# Patient Record
Sex: Female | Born: 1985 | Hispanic: Refuse to answer | Marital: Married | Smoking: Never smoker
Health system: Southern US, Community
[De-identification: ages and names within clinical notes are randomized; demographics above are authoritative.]

## PROBLEM LIST (undated history)

## (undated) DIAGNOSIS — Z789 Other specified health status: Secondary | ICD-10-CM

## (undated) DIAGNOSIS — K219 Gastro-esophageal reflux disease without esophagitis: Secondary | ICD-10-CM

## (undated) HISTORY — PX: NO PAST SURGERIES: SHX2092

---

## 2012-08-18 ENCOUNTER — Emergency Department (HOSPITAL_BASED_OUTPATIENT_CLINIC_OR_DEPARTMENT_OTHER)
Admission: EM | Admit: 2012-08-18 | Discharge: 2012-08-18 | Disposition: A | Discharge status: Home / Self Care | Payer: BC Managed Care – PPO | Attending: Emergency Medicine | Admitting: Emergency Medicine

## 2012-08-18 ENCOUNTER — Encounter (HOSPITAL_BASED_OUTPATIENT_CLINIC_OR_DEPARTMENT_OTHER): Payer: Self-pay | Admitting: *Deleted

## 2012-08-18 ENCOUNTER — Emergency Department (HOSPITAL_BASED_OUTPATIENT_CLINIC_OR_DEPARTMENT_OTHER): Payer: BC Managed Care – PPO

## 2012-08-18 DIAGNOSIS — M79609 Pain in unspecified limb: Secondary | ICD-10-CM | POA: Insufficient documentation

## 2012-08-18 DIAGNOSIS — M79605 Pain in left leg: Secondary | ICD-10-CM

## 2012-08-18 NOTE — ED Provider Notes (Signed)
History    CSN: [id number] Arrival date & time 08/18/12  0272  First MD Initiated Contact with Patient 08/18/12 [phone]     Chief Complaint  Patient presents with  . Leg Pain   (Consider location/radiation/quality/duration/timing/severity/associated sxs/prior Treatment) Patient is a 27 y.o. female presenting with leg pain.  Leg Pain  Pt with no significant PMH reports she has had aching pain in L posterior thigh since yesterday. No recent injury or overuse. She woke up today and the leg felt cold. She has family history of DVT/PE and so she became concerned that she may have DVT. Denies leg swelling, CP, SOB, palpitations.    History reviewed. No pertinent past medical history. History reviewed. No pertinent past surgical history. History reviewed. No pertinent family history. History  Substance Use Topics  . Smoking status: Never Smoker   . Smokeless tobacco: Not on file  . Alcohol Use: Not on file   OB History   Grav Para Term Preterm Abortions TAB SAB Ect Mult Living                 Review of Systems All other systems reviewed and are negative except as noted in HPI.   Allergies  Review of patient's allergies indicates no known allergies.  Home Medications   Current Outpatient Rx  Name  Route  Sig  Dispense  Refill  . levonorgestrel-ethinyl estradiol (AVIANE,ALESSE,LESSINA) 0.1-20 MG-MCG tablet   Oral   Take 1 tablet by mouth daily.          BP 114/72  Pulse 83  Temp(Src) 97.9 F (36.6 C) (Oral)  Resp 16  Ht 5\' 3"  (1.6 m)  Wt 120 lb (54.432 kg)  BMI 21.26 kg/m2  SpO2 100%  LMP 08/16/2012 Physical Exam  Nursing note and vitals reviewed. Constitutional: She is oriented to person, place, and time. She appears well-developed and well-nourished.  HENT:  Head: Normocephalic and atraumatic.  Eyes: EOM are normal. Pupils are equal, round, and reactive to light.  Neck: Normal range of motion. Neck supple.  Cardiovascular: Normal rate, normal heart sounds and  intact distal pulses.   Pulmonary/Chest: Effort normal and breath sounds normal.  Abdominal: Bowel sounds are normal. She exhibits no distension. There is no tenderness.  Musculoskeletal: Normal range of motion. She exhibits tenderness (mild, L hamstring soreness, no tenderness over the deep vein system. neg Homan's). She exhibits no edema.  Neurological: She is alert and oriented to person, place, and time. She has normal strength. No cranial nerve deficit or sensory deficit.  Skin: Skin is warm and dry. No rash noted.  Psychiatric: She has a normal mood and affect.    ED Course  Procedures (including critical care time) Labs Reviewed - No data to display US Venous Img Lower Unilateral Left  08/18/2012   *RADIOLOGY REPORT*  Clinical Data: Left posterior thigh cramping.  Question DVT.  LEFT LOWER EXTREMITY VENOUS DUPLEX ULTRASOUND  Technique:  Gray-scale sonography with graded compression, as well as color Doppler and duplex ultrasound were performed to evaluate the deep venous system of the lower extremity from the level of the common femoral vein through the popliteal and proximal calf veins. Spectral Doppler was utilized to evaluate flow at rest and with distal augmentation maneuvers.  Comparison:  None.  Findings:  Normal compressibility of the common femoral, superficial femoral, and popliteal veins is demonstrated, as well as the visualized proximal calf veins.  No filling defects to suggest DVT on grayscale or color Doppler imaging.  Doppler waveforms show normal direction of venous flow, normal respiratory phasicity and response to augmentation.  IMPRESSION: No evidence of left lower extremity deep vein thrombosis.   Original Report Authenticated By: Carey Bullocks, M.D.   1. Left leg pain     MDM  [place] neg for DVT. Likely muscle strain. Advised NSAIDs if needed. Return for any other concerns.   Charles B. Bernette Mayers, MD 08/18/12 [phone]

## 2012-08-18 NOTE — ED Notes (Signed)
Patient transported to Ultrasound 

## 2012-08-18 NOTE — ED Notes (Signed)
C/o left leg pain since yesterday with some numbness this AM. Pt states that she recently started BCP.

## 2014-07-27 IMAGING — US US EXTREM LOW VENOUS*L*
1 series · 14 of 22 positions shown · non-contrast
Comparison: None.

CLINICAL DATA: Left posterior thigh cramping.  Question DVT.

LEFT LOWER EXTREMITY VENOUS DUPLEX ULTRASOUND
TECHNIQUE: Gray-scale sonography with graded compression, as well
as color Doppler and duplex ultrasound were performed to evaluate
the deep venous system of the lower extremity from the level of the
common femoral vein through the popliteal and proximal calf veins.
Spectral Doppler was utilized to evaluate flow at rest and with
distal augmentation maneuvers.

[Series 1: us extrem low venous*left* · 14 of 22 slices shown]
[im 1/22]
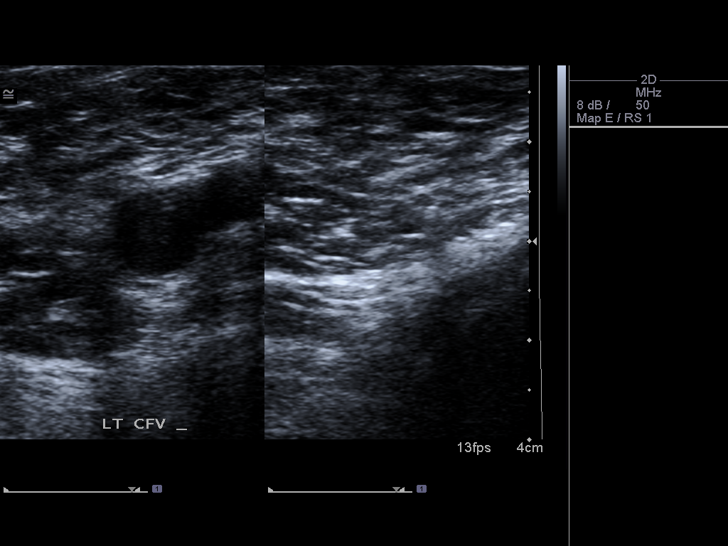
[im 3/22]
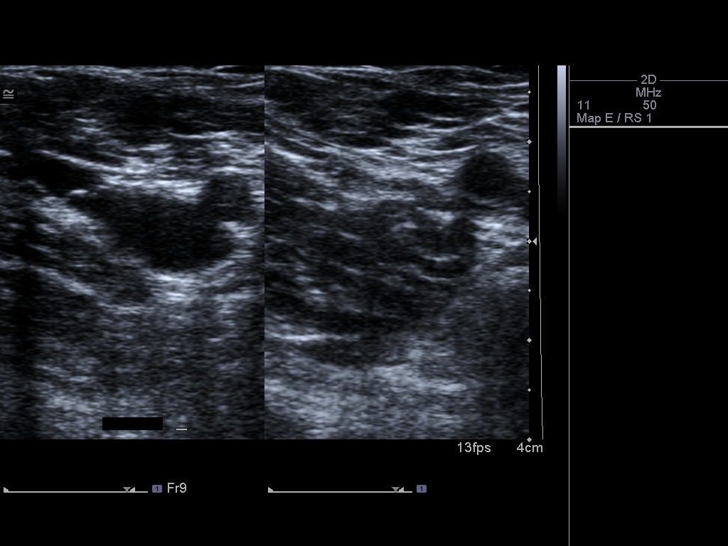
[im 4/22]
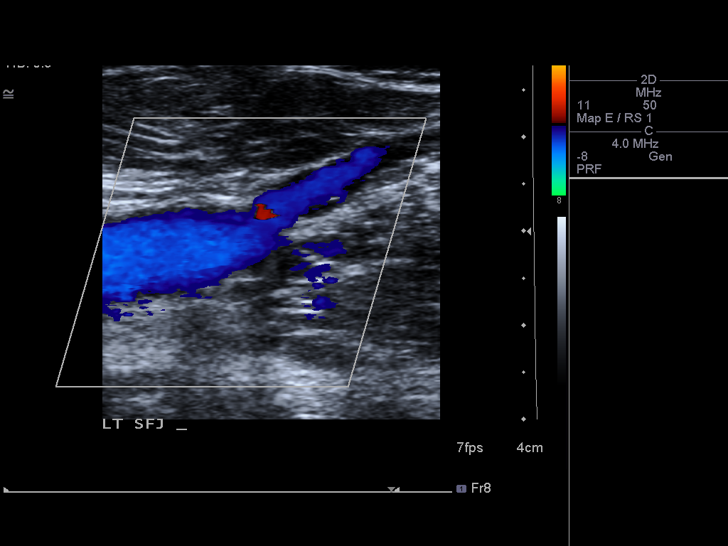
[im 6/22]
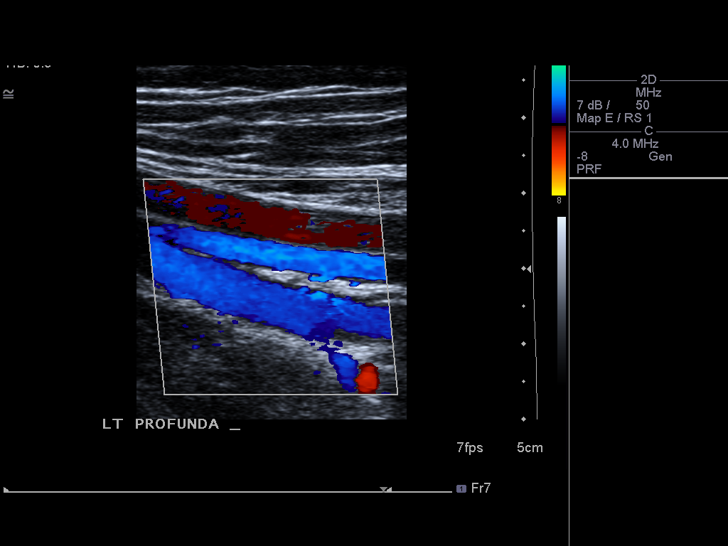
[im 8/22]
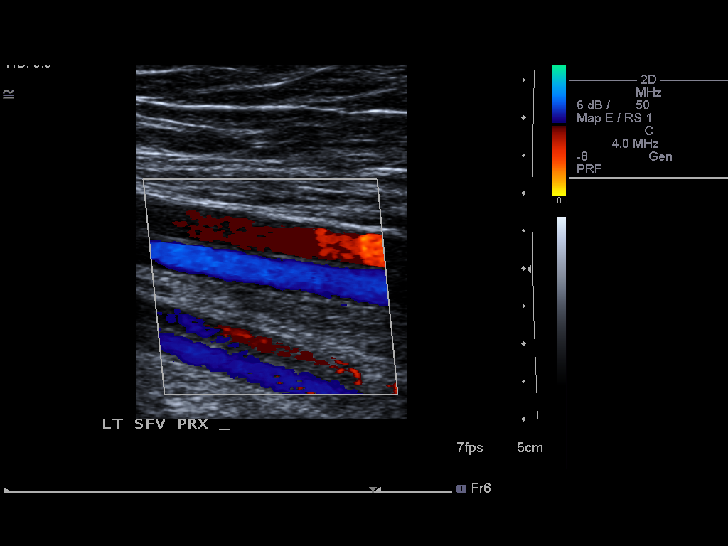
[im 9/22]
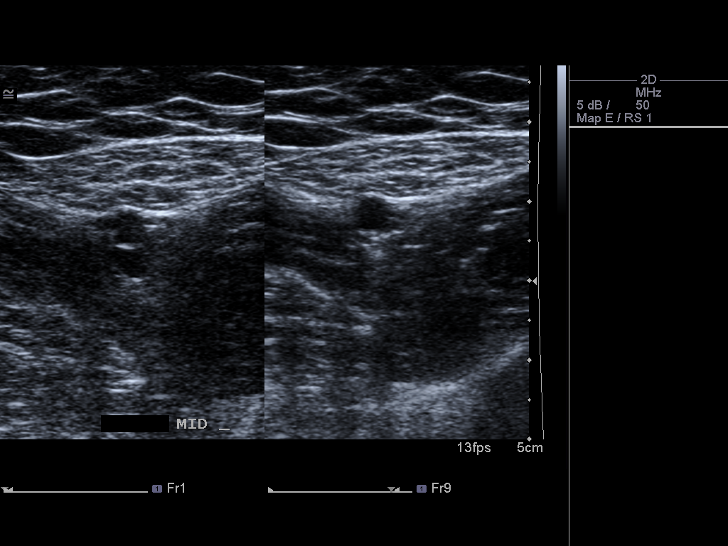
[im 11/22]
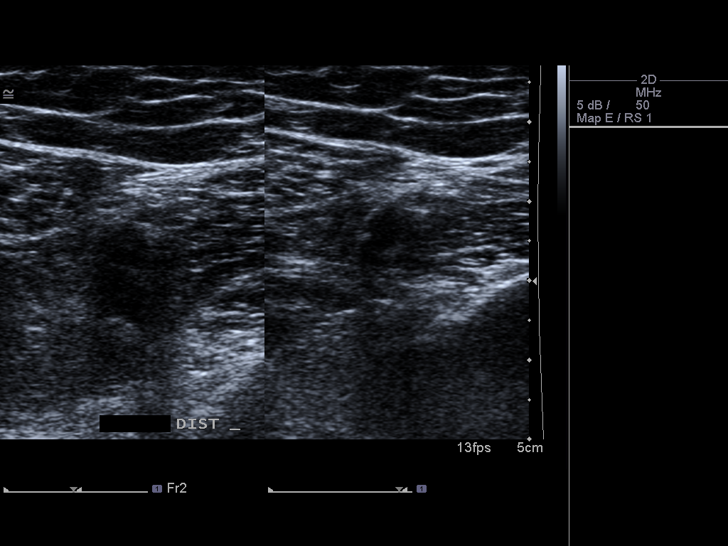
[im 12/22]
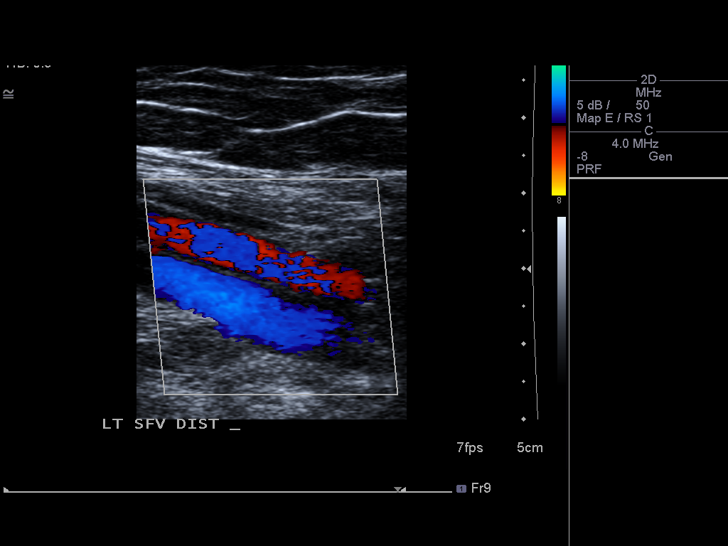
[im 14/22]
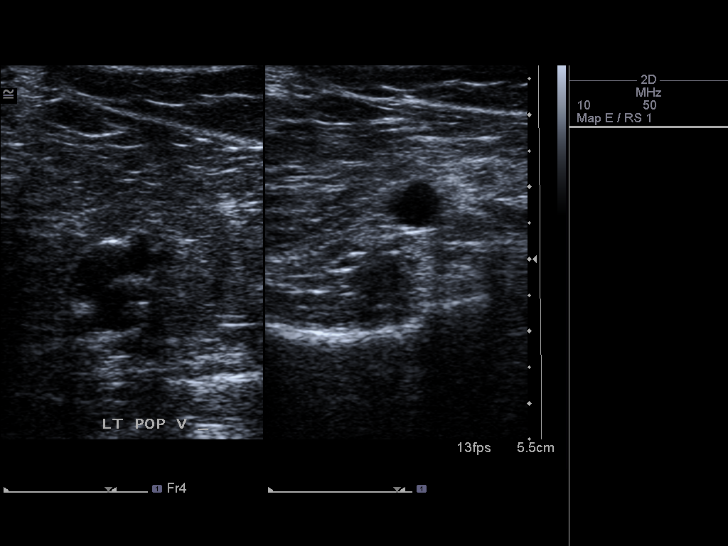
[im 15/22]
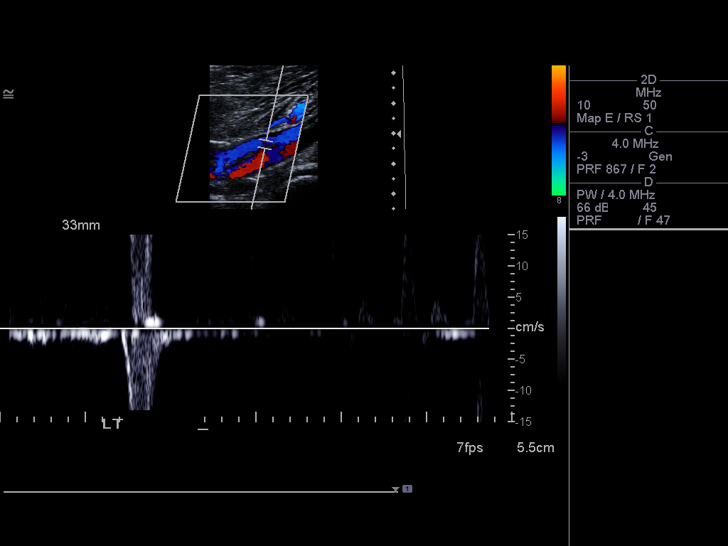
[im 17/22]
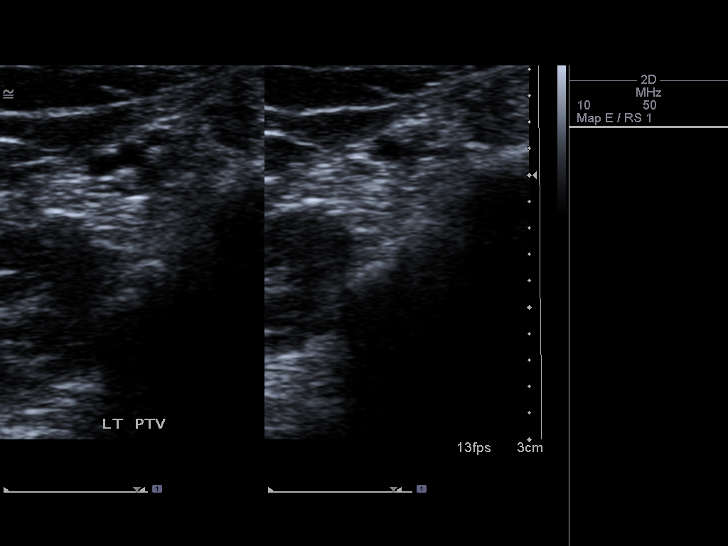
[im 19/22]
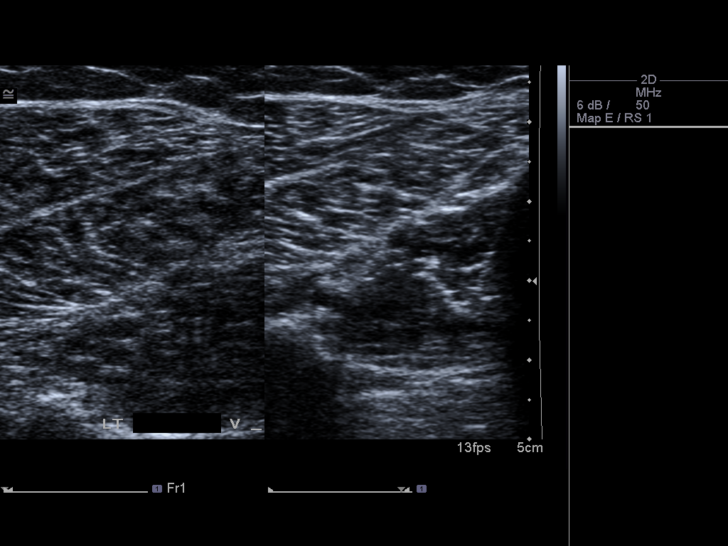
[im 20/22]
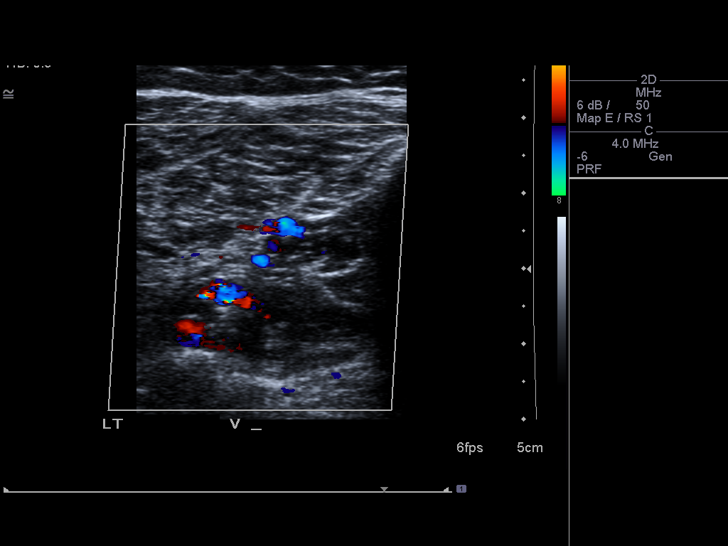
[im 22/22]
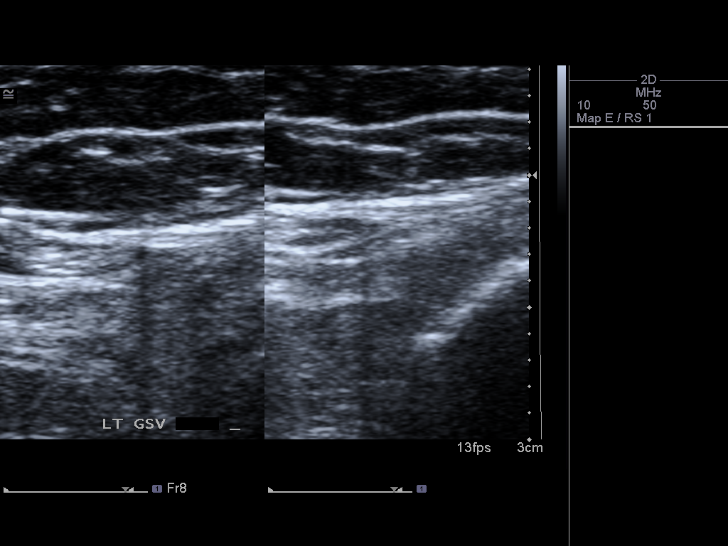

[14 of 22 positions shown; findings below may reference images not displayed]

FINDINGS: Normal compressibility of the common femoral,
superficial femoral, and popliteal veins is demonstrated, as well
as the visualized proximal calf veins.  No filling defects to
suggest DVT on grayscale or color Doppler imaging.  Doppler
waveforms show normal direction of venous flow, normal respiratory
phasicity and response to augmentation.
IMPRESSION: No evidence of left lower extremity deep vein thrombosis.

## 2017-11-25 DIAGNOSIS — Z3201 Encounter for pregnancy test, result positive: Secondary | ICD-10-CM | POA: Diagnosis not present

## 2017-12-16 DIAGNOSIS — Z3201 Encounter for pregnancy test, result positive: Secondary | ICD-10-CM | POA: Diagnosis not present

## 2017-12-16 DIAGNOSIS — Z6823 Body mass index (BMI) 23.0-23.9, adult: Secondary | ICD-10-CM | POA: Diagnosis not present

## 2017-12-30 DIAGNOSIS — Z3689 Encounter for other specified antenatal screening: Secondary | ICD-10-CM | POA: Diagnosis not present

## 2017-12-30 DIAGNOSIS — Z118 Encounter for screening for other infectious and parasitic diseases: Secondary | ICD-10-CM | POA: Diagnosis not present

## 2017-12-30 DIAGNOSIS — Z3481 Encounter for supervision of other normal pregnancy, first trimester: Secondary | ICD-10-CM | POA: Diagnosis not present

## 2017-12-30 DIAGNOSIS — Z124 Encounter for screening for malignant neoplasm of cervix: Secondary | ICD-10-CM | POA: Diagnosis not present

## 2017-12-30 DIAGNOSIS — Z01419 Encounter for gynecological examination (general) (routine) without abnormal findings: Secondary | ICD-10-CM | POA: Diagnosis not present

## 2018-01-11 DIAGNOSIS — Z3481 Encounter for supervision of other normal pregnancy, first trimester: Secondary | ICD-10-CM | POA: Diagnosis not present

## 2018-01-11 LAB — OB RESULTS CONSOLE HIV ANTIBODY (ROUTINE TESTING): HIV: NONREACTIVE

## 2018-01-11 LAB — OB RESULTS CONSOLE RPR: RPR: NONREACTIVE

## 2018-01-11 LAB — OB RESULTS CONSOLE GC/CHLAMYDIA
Chlamydia: NEGATIVE
Gonorrhea: NEGATIVE

## 2018-01-11 LAB — OB RESULTS CONSOLE ABO/RH: RH Type: POSITIVE

## 2018-01-11 LAB — OB RESULTS CONSOLE RUBELLA ANTIBODY, IGM: Rubella: IMMUNE

## 2018-01-11 LAB — OB RESULTS CONSOLE HEPATITIS B SURFACE ANTIGEN: Hepatitis B Surface Ag: NEGATIVE

## 2018-02-08 DIAGNOSIS — Z1329 Encounter for screening for other suspected endocrine disorder: Secondary | ICD-10-CM | POA: Diagnosis not present

## 2018-02-08 DIAGNOSIS — Z3481 Encounter for supervision of other normal pregnancy, first trimester: Secondary | ICD-10-CM | POA: Diagnosis not present

## 2018-02-24 NOTE — L&D Delivery Note (Signed)
Delivery Note At 7:15 PM a viable and healthy female was delivered via Vaginal, Spontaneous (Presentation: OA ).  APGAR: 8, 9; weight  Pending Thick mec stained fluid noted after birth.  Placenta status: spontaneous, complete, .  Cord:  with the following complications:NOne .  Cord pH: NA  Anesthesia:  Pudendal block  Episiotomy: Median Lacerations: 3rd degree perineal, bilateral vaginal sulcus, left longer 4-5 cm.  Suture Repair: 3.0 vicryl rapide Est. Blood Loss (mL):  350 cc  Mom to postpartum.  Baby to Couplet care / Skin to Skin.  Mary Lindsey 07/20/2018, 8:07 PM

## 2018-03-01 DIAGNOSIS — Z3482 Encounter for supervision of other normal pregnancy, second trimester: Secondary | ICD-10-CM | POA: Diagnosis not present

## 2018-03-01 DIAGNOSIS — Z363 Encounter for antenatal screening for malformations: Secondary | ICD-10-CM | POA: Diagnosis not present

## 2018-03-15 DIAGNOSIS — Z3482 Encounter for supervision of other normal pregnancy, second trimester: Secondary | ICD-10-CM | POA: Diagnosis not present

## 2018-03-15 DIAGNOSIS — Z362 Encounter for other antenatal screening follow-up: Secondary | ICD-10-CM | POA: Diagnosis not present

## 2018-04-12 DIAGNOSIS — Z3482 Encounter for supervision of other normal pregnancy, second trimester: Secondary | ICD-10-CM | POA: Diagnosis not present

## 2018-05-03 DIAGNOSIS — Z3483 Encounter for supervision of other normal pregnancy, third trimester: Secondary | ICD-10-CM | POA: Diagnosis not present

## 2018-05-03 DIAGNOSIS — Z3689 Encounter for other specified antenatal screening: Secondary | ICD-10-CM | POA: Diagnosis not present

## 2018-05-18 DIAGNOSIS — Z3483 Encounter for supervision of other normal pregnancy, third trimester: Secondary | ICD-10-CM | POA: Diagnosis not present

## 2018-06-01 DIAGNOSIS — Z3483 Encounter for supervision of other normal pregnancy, third trimester: Secondary | ICD-10-CM | POA: Diagnosis not present

## 2018-06-15 DIAGNOSIS — Z3483 Encounter for supervision of other normal pregnancy, third trimester: Secondary | ICD-10-CM | POA: Diagnosis not present

## 2018-06-28 DIAGNOSIS — Z3685 Encounter for antenatal screening for Streptococcus B: Secondary | ICD-10-CM | POA: Diagnosis not present

## 2018-06-28 DIAGNOSIS — Z3483 Encounter for supervision of other normal pregnancy, third trimester: Secondary | ICD-10-CM | POA: Diagnosis not present

## 2018-06-30 LAB — OB RESULTS CONSOLE GBS: GBS: NEGATIVE

## 2018-07-05 DIAGNOSIS — Z3483 Encounter for supervision of other normal pregnancy, third trimester: Secondary | ICD-10-CM | POA: Diagnosis not present

## 2018-07-14 DIAGNOSIS — Z3403 Encounter for supervision of normal first pregnancy, third trimester: Secondary | ICD-10-CM | POA: Diagnosis not present

## 2018-07-20 ENCOUNTER — Other Ambulatory Visit: Payer: Self-pay

## 2018-07-20 ENCOUNTER — Encounter (HOSPITAL_COMMUNITY): Payer: Self-pay

## 2018-07-20 ENCOUNTER — Inpatient Hospital Stay (HOSPITAL_COMMUNITY)
Admission: AD | Admit: 2018-07-20 | Discharge: 2018-07-22 | DRG: 768 | Disposition: A | Discharge status: Home / Self Care | Payer: BLUE CROSS/BLUE SHIELD | Attending: Obstetrics & Gynecology | Admitting: Obstetrics & Gynecology

## 2018-07-20 DIAGNOSIS — O1494 Unspecified pre-eclampsia, complicating childbirth: Secondary | ICD-10-CM | POA: Diagnosis not present

## 2018-07-20 DIAGNOSIS — Z6791 Unspecified blood type, Rh negative: Secondary | ICD-10-CM

## 2018-07-20 DIAGNOSIS — O1404 Mild to moderate pre-eclampsia, complicating childbirth: Secondary | ICD-10-CM | POA: Diagnosis not present

## 2018-07-20 DIAGNOSIS — Z1159 Encounter for screening for other viral diseases: Secondary | ICD-10-CM

## 2018-07-20 DIAGNOSIS — R030 Elevated blood-pressure reading, without diagnosis of hypertension: Secondary | ICD-10-CM | POA: Diagnosis not present

## 2018-07-20 DIAGNOSIS — Z3A39 39 weeks gestation of pregnancy: Secondary | ICD-10-CM | POA: Diagnosis not present

## 2018-07-20 DIAGNOSIS — O1403 Mild to moderate pre-eclampsia, third trimester: Secondary | ICD-10-CM

## 2018-07-20 DIAGNOSIS — O26893 Other specified pregnancy related conditions, third trimester: Secondary | ICD-10-CM | POA: Diagnosis present

## 2018-07-20 HISTORY — DX: Gastro-esophageal reflux disease without esophagitis: K21.9

## 2018-07-20 HISTORY — DX: Other specified health status: Z78.9

## 2018-07-20 HISTORY — DX: Mild to moderate pre-eclampsia, third trimester: O14.03

## 2018-07-20 LAB — CBC WITH DIFFERENTIAL/PLATELET
Abs Immature Granulocytes: 0.06 10*3/uL (ref 0.00–0.07)
Basophils Absolute: 0 10*3/uL (ref 0.0–0.1)
Basophils Relative: 0 %
Eosinophils Absolute: 0 10*3/uL (ref 0.0–0.5)
Eosinophils Relative: 0 %
HCT: 39.8 % (ref 36.0–46.0)
Hemoglobin: 13.8 g/dL (ref 12.0–15.0)
Immature Granulocytes: 0 %
Lymphocytes Relative: 11 %
Lymphs Abs: 1.6 10*3/uL (ref 0.7–4.0)
MCH: 29.6 pg (ref 26.0–34.0)
MCHC: 34.7 g/dL (ref 30.0–36.0)
MCV: 85.4 fL (ref 80.0–100.0)
Monocytes Absolute: 1.2 10*3/uL — ABNORMAL HIGH (ref 0.1–1.0)
Monocytes Relative: 8 %
Neutro Abs: 11.7 10*3/uL — ABNORMAL HIGH (ref 1.7–7.7)
Neutrophils Relative %: 81 %
Platelets: 181 10*3/uL (ref 150–400)
RBC: 4.66 MIL/uL (ref 3.87–5.11)
RDW: 13.1 % (ref 11.5–15.5)
WBC: 14.5 10*3/uL — ABNORMAL HIGH (ref 4.0–10.5)
nRBC: 0 % (ref 0.0–0.2)

## 2018-07-20 LAB — ABO/RH: ABO/RH(D): A POS

## 2018-07-20 LAB — TYPE AND SCREEN
ABO/RH(D): A POS
Antibody Screen: NEGATIVE

## 2018-07-20 LAB — COMPREHENSIVE METABOLIC PANEL
ALT: 22 U/L (ref 0–44)
AST: 23 U/L (ref 15–41)
Albumin: 3 g/dL — ABNORMAL LOW (ref 3.5–5.0)
Alkaline Phosphatase: 203 U/L — ABNORMAL HIGH (ref 38–126)
Anion gap: 14 (ref 5–15)
BUN: 14 mg/dL (ref 6–20)
CO2: 19 mmol/L — ABNORMAL LOW (ref 22–32)
Calcium: 9.8 mg/dL (ref 8.9–10.3)
Chloride: 102 mmol/L (ref 98–111)
Creatinine, Ser: 0.85 mg/dL (ref 0.44–1.00)
GFR calc Af Amer: >60 mL/min (ref >60)
GFR calc non Af Amer: >60 mL/min (ref >60)
Glucose, Bld: 105 mg/dL — ABNORMAL HIGH (ref 70–99)
Potassium: 3.6 mmol/L (ref 3.5–5.1)
Sodium: 135 mmol/L (ref 135–145)
Total Bilirubin: 0.6 mg/dL (ref 0.3–1.2)
Total Protein: 6.9 g/dL (ref 6.5–8.1)

## 2018-07-20 LAB — SARS CORONAVIRUS 2 BY RT PCR (HOSPITAL ORDER, PERFORMED IN ~~LOC~~ HOSPITAL LAB): SARS Coronavirus 2: NEGATIVE

## 2018-07-20 LAB — PROTEIN / CREATININE RATIO, URINE
Creatinine, Urine: 159.68 mg/dL
Protein Creatinine Ratio: 0.34 mg/mg{Cre} — ABNORMAL HIGH (ref 0.00–0.15)
Total Protein, Urine: 54 mg/dL

## 2018-07-20 MED ORDER — COCONUT OIL OIL
1.0000 "application " | TOPICAL_OIL | Status: DC | PRN
  Administered 2018-07-20: 1 via TOPICAL

## 2018-07-20 MED ORDER — OXYTOCIN 40 UNITS IN NORMAL SALINE INFUSION - SIMPLE MED
2.5000 [IU]/h | INTRAVENOUS | Status: DC
  Administered 2018-07-20: 2.5 [IU]/h via INTRAVENOUS
  Filled 2018-07-20: qty 1000

## 2018-07-20 MED ORDER — FENTANYL CITRATE (PF) 100 MCG/2ML IJ SOLN
100.0000 ug | Freq: Once | INTRAMUSCULAR | Status: AC
  Administered 2018-07-20: 100 ug via INTRAVENOUS

## 2018-07-20 MED ORDER — TETANUS-DIPHTH-ACELL PERTUSSIS 5-2.5-18.5 LF-MCG/0.5 IM SUSP: 0.5000 mL | Freq: Once | INTRAMUSCULAR | Status: DC

## 2018-07-20 MED ORDER — OXYCODONE-ACETAMINOPHEN 5-325 MG PO TABS: 2.0000 | ORAL_TABLET | ORAL | Status: DC | PRN

## 2018-07-20 MED ORDER — ACETAMINOPHEN 325 MG PO TABS: 650.0000 mg | ORAL_TABLET | ORAL | Status: DC | PRN

## 2018-07-20 MED ORDER — ZOLPIDEM TARTRATE 5 MG PO TABS: 5.0000 mg | ORAL_TABLET | Freq: Every evening | ORAL | Status: DC | PRN

## 2018-07-20 MED ORDER — ONDANSETRON HCL 4 MG/2ML IJ SOLN: 4.0000 mg | Freq: Four times a day (QID) | INTRAMUSCULAR | Status: DC | PRN

## 2018-07-20 MED ORDER — OXYCODONE HCL 5 MG PO TABS: 5.0000 mg | ORAL_TABLET | ORAL | Status: DC | PRN

## 2018-07-20 MED ORDER — WITCH HAZEL-GLYCERIN EX PADS: 1.0000 "application " | MEDICATED_PAD | CUTANEOUS | Status: DC | PRN

## 2018-07-20 MED ORDER — SOD CITRATE-CITRIC ACID 500-334 MG/5ML PO SOLN: 30.0000 mL | ORAL | Status: DC | PRN

## 2018-07-20 MED ORDER — ONDANSETRON HCL 4 MG/2ML IJ SOLN: 4.0000 mg | INTRAMUSCULAR | Status: DC | PRN

## 2018-07-20 MED ORDER — LACTATED RINGERS IV SOLN
500.0000 mL | INTRAVENOUS | Status: DC | PRN
  Administered 2018-07-20: 1000 mL via INTRAVENOUS

## 2018-07-20 MED ORDER — OXYTOCIN BOLUS FROM INFUSION
500.0000 mL | Freq: Once | INTRAVENOUS | Status: AC
  Administered 2018-07-20: 19:00:00 500 mL via INTRAVENOUS

## 2018-07-20 MED ORDER — IBUPROFEN 600 MG PO TABS
600.0000 mg | ORAL_TABLET | Freq: Four times a day (QID) | ORAL | Status: DC
  Administered 2018-07-20 – 2018-07-22 (×6): 600 mg via ORAL
  Filled 2018-07-20 (×6): qty 1

## 2018-07-20 MED ORDER — FENTANYL CITRATE (PF) 100 MCG/2ML IJ SOLN
INTRAMUSCULAR | Status: AC
  Administered 2018-07-20: 100 ug via INTRAVENOUS
  Filled 2018-07-20: qty 2

## 2018-07-20 MED ORDER — DIPHENHYDRAMINE HCL 25 MG PO CAPS: 25.0000 mg | ORAL_CAPSULE | Freq: Four times a day (QID) | ORAL | Status: DC | PRN

## 2018-07-20 MED ORDER — FLEET ENEMA 7-19 GM/118ML RE ENEM: 1.0000 | ENEMA | RECTAL | Status: DC | PRN

## 2018-07-20 MED ORDER — SENNOSIDES-DOCUSATE SODIUM 8.6-50 MG PO TABS
2.0000 | ORAL_TABLET | ORAL | Status: DC
  Administered 2018-07-20 – 2018-07-21 (×2): 2 via ORAL
  Filled 2018-07-20 (×2): qty 2

## 2018-07-20 MED ORDER — PRENATAL MULTIVITAMIN CH
1.0000 | ORAL_TABLET | Freq: Every day | ORAL | Status: DC
  Administered 2018-07-21: 1 via ORAL
  Filled 2018-07-20: qty 1

## 2018-07-20 MED ORDER — ONDANSETRON HCL 4 MG PO TABS: 4.0000 mg | ORAL_TABLET | ORAL | Status: DC | PRN

## 2018-07-20 MED ORDER — LIDOCAINE HCL (PF) 1 % IJ SOLN
30.0000 mL | INTRAMUSCULAR | Status: AC | PRN
  Administered 2018-07-20 (×3): 30 mL via SUBCUTANEOUS
  Filled 2018-07-20 (×3): qty 30

## 2018-07-20 MED ORDER — DOCUSATE SODIUM 100 MG PO CAPS
100.0000 mg | ORAL_CAPSULE | Freq: Two times a day (BID) | ORAL | Status: DC
  Administered 2018-07-20 – 2018-07-21 (×3): 100 mg via ORAL
  Filled 2018-07-20 (×3): qty 1

## 2018-07-20 MED ORDER — BENZOCAINE-MENTHOL 20-0.5 % EX AERO
1.0000 "application " | INHALATION_SPRAY | CUTANEOUS | Status: DC | PRN
  Administered 2018-07-20: 1 via TOPICAL
  Filled 2018-07-20: qty 56

## 2018-07-20 MED ORDER — LACTATED RINGERS IV SOLN
INTRAVENOUS | Status: DC
  Administered 2018-07-20 (×2): via INTRAVENOUS

## 2018-07-20 MED ORDER — OXYCODONE-ACETAMINOPHEN 5-325 MG PO TABS: 1.0000 | ORAL_TABLET | ORAL | Status: DC | PRN

## 2018-07-20 MED ORDER — SIMETHICONE 80 MG PO CHEW: 80.0000 mg | CHEWABLE_TABLET | ORAL | Status: DC | PRN

## 2018-07-20 MED ORDER — DIBUCAINE (PERIANAL) 1 % EX OINT: 1.0000 "application " | TOPICAL_OINTMENT | CUTANEOUS | Status: DC | PRN

## 2018-07-20 NOTE — Progress Notes (Signed)
Mary Lindsey is a 33 y.o. G1P0000 at [redacted]w[redacted]d, preeclampsia without severe features. Admitted in early labor due to PEC. Pt presented for labor check with no HA/ SOB/ CP/ RUQ pain or swelling  Subjective: UCs are hurting, but planning natural birth  Objective: BP 130/86 (BP Location: Left Arm)   Pulse 68   Temp 97.8 F (36.6 C) (Oral)   Resp 18   Ht 5\' 3"  (1.6 m)   Wt 75.8 kg   SpO2 98%   BMI 29.58 kg/m   FHT:  FHR: 140s bpm, variability: moderate,  accelerations:  Present,  decelerations:  Absent UC:   regular, every 3-4 minutes, spontaneous labor SVE:  4-5/100%/-2/ Vx AROM clear fluid, bloody show  Assessment / Plan: Spontaneous labor, admitted in early labor with diagnosis of PEC- mild, progressing well spontaneously. Prefers low intervention plan  Preeclampsia:  no signs or symptoms of toxicity, intake and ouput balanced and labs stable, no magnesium Fetal Wellbeing:  Category I Pain Control:  Labor support without medications, reviewed positions that can help, possible warm shower etc, IVHL to allow ambulation, will give IVFluids intermittently as needed  I/D:  n/a Anticipated MOD:  NSVD EFW 8 lbs  Robley Fries 07/20/2018, 4:29 PM

## 2018-07-20 NOTE — Progress Notes (Signed)
Mary Lindsey is a 33 y.o. G1P0000 at [redacted]w[redacted]d, preeclampsia without severe features, admitted for labor AOL. Pt presented for labor check with no HA/ SOB/ CP/ RUQ pain or swelling  Subjective: UCs are hurting, but planning natural birth  Objective: BP 134/84   Pulse 75   Temp 97.8 F (36.6 C) (Oral)   Resp 20   Ht 5\' 3"  (1.6 m)   Wt 75.8 kg   SpO2 98%   BMI 29.58 kg/m  No intake/output data recorded. No intake/output data recorded.  FHT:  FHR: 140s bpm, variability: moderate,  accelerations:  Present,  decelerations:  Absent UC:   regular, every 3-4 minutes, spontaneous labor SVE:  Repeat exam deferred, last check at 10.30 am  Labs: reviewed CBC, CMP, urine p/c    Assessment / Plan: Preeclampsia- mild admitted for AOL. Will reassess at 4 hrs from last exam and discuss AROM or cervical foey if no change   Preeclampsia:  no signs or symptoms of toxicity, intake and ouput balanced and labs stable Fetal Wellbeing:  Category I Pain Control:  Labor support without medications I/D:  n/a Anticipated MOD:  NSVD  Robley Fries 07/20/2018, 2:16 PM

## 2018-07-20 NOTE — MAU Note (Signed)
Contractions started at 0430.  They are less than 3 min apart.  Was half cm last week for cervix check.  No bleeding. No leaking. Clear mucus today not watery.  Baby moving well. GBS neg per pt.

## 2018-07-20 NOTE — H&P (Signed)
Mary Lindsey is a 33 y.o. G1P0 at 2638w5d gestation presents for complaint of Contractions that began around 0500.  Denies lof, vb, +fm.  Went to MAU for eval for r/o labor and elevated bps noted - mild range - and pr/cr ratio c/w pre-eclampsia.  Denies h/a, vision changes, ruq pain.  Antepartum course: uncomplicated, uncle with spina bifida- [facility]  See complete pre-natal records  History OB History    Gravida  1   Para      Term      Preterm      AB      Living        SAB      TAB      Ectopic      Multiple      Live Births             Past Medical History:  Diagnosis Date  . Medical history non-contributory    Past Surgical History:  Procedure Laterality Date  . NO PAST SURGERIES     Family History: family history includes Colon cancer in her father. Social History:  reports that she has never smoked. She has never used smokeless tobacco. She reports that she does not drink alcohol or use drugs.  ROS: See above otherwise negative  Prenatal labs:  ABO, Rh:  ab neg Antibody:  neg Rubella:  immune RPR:   neg  HBsAg:   neg HIV:  neg GBS:   neg 1 hr Glucola: Normal Genetic screening: declined Anatomy US: Normal  Physical Exam:   Dilation: 1.5 Effacement (%): 90 Station: -2 Exam by:: B. Bowen, RN  Blood pressure 128/89, pulse 95, temperature 98.1 F (36.7 C), temperature source Oral, resp. rate 16, height 5\' 3"  (1.6 m), weight 75.8 kg, SpO2 98 %. A&O x 3 HEENT: Normal Lungs: CTAB CV: RRR Abdominal: Soft, Non-tender, Gravid and Estimated fetal weight: 7-7 1/2 lbs  Lower Extremities: Non-edematous, Non-tender  Pelvic Exam:  Deferred since just examined  Labs:  CBC:  Lab Results  Component Value Date   WBC 14.5 (H) 07/20/2018   RBC 4.66 07/20/2018   HGB 13.8 07/20/2018   HCT 39.8 07/20/2018   MCV 85.4 07/20/2018   MCH 29.6 07/20/2018   MCHC 34.7 07/20/2018   RDW 13.1 07/20/2018   PLT 181 07/20/2018   CMP:  Lab Results  Component Value Date   NA 135 07/20/2018   K 3.6 07/20/2018   CL 102 07/20/2018   CO2 19 (L) 07/20/2018   GLUCOSE 105 (H) 07/20/2018   BUN 14 07/20/2018   CREATININE 0.85 07/20/2018   CALCIUM 9.8 07/20/2018   PROT 6.9 07/20/2018   AST 23 07/20/2018   ALT 22 07/20/2018   ALBUMIN 3.0 (L) 07/20/2018   ALKPHOS 203 (H) 07/20/2018   BILITOT 0.6 07/20/2018   GFRNONAA >60 07/20/2018   GFRAA >60 07/20/2018   ANIONGAP 14 07/20/2018   Urine: No results found for: COLORURINE, APPEARANCEUR, LABSPEC, PHURINE, GLUCOSEU, HGBUR, BILIRUBINUR, KETONESUR, PROTEINUR, NITRITE, LEUKOCYTESUR   Prenatal Transfer Tool  Maternal Diabetes: No Genetic Screening: Normal Maternal Ultrasounds/Referrals: Normal Fetal Ultrasounds or other Referrals:  None Maternal Substance Abuse:  No Significant Maternal Medications:  None Significant Maternal Lab Results: Lab values include: Group B Strep negative  FHT: 140, nml variability, +accels, occ variable Toco: irreg q 1-3   Assessment/Plan:  33 y.o. G1P0 at 7138w5d gestation   1. Mild pre-eclampsia - asymptomatic,  Will augment with pitocin  if needed; plan svd; monitor bps; preferring to not have epidural, aware if bps elevated may benefit from epidural. 2. Fetal status reassuring 3. gbs neg 4. Rh neg    Vick Frees 07/20/2018, 11:56 AM

## 2018-07-20 NOTE — MAU Provider Note (Addendum)
History     CSN: [id number]  Arrival date and time: 07/20/18 4098   First Provider Initiated Contact with Patient 07/20/18 [phone]      Chief Complaint  Patient presents with  . Contractions   HPI Mary Lindsey is a 33 y.o. G1P0 at [redacted]w[redacted]d who presents to MAU as labor check and was determined to have new onset elevated blood pressure without severe features.  Patient endorses new onset contraction pain located bilaterally in her lower abdomen. She rates her pain as 6-9/10. She Denies vaginal bleeding, leaking of fluid, decreased fetal movement, fever, falls, or recent illness.    Patient denies history of elevated blood pressure. She also denies headache, visual disturbances, ruq/upper abdominal pain, new onset swelling or weight gain.  OB History    Gravida  1   Para      Term      Preterm      AB      Living        SAB      TAB      Ectopic      Multiple      Live Births              Past Medical History:  Diagnosis Date  . Medical history non-contributory     Past Surgical History:  Procedure Laterality Date  . NO PAST SURGERIES      Family History  Problem Relation Age of Onset  . Colon cancer Father     Social History   Tobacco Use  . Smoking status: Never Smoker  . Smokeless tobacco: Never Used  Substance Use Topics  . Alcohol use: Never    Frequency: Never  . Drug use: Never    Allergies: No Known Allergies  Medications Prior to Admission  Medication Sig Dispense Refill Last Dose  . levocetirizine (XYZAL) 5 MG tablet Take 5 mg by mouth every evening.   Past Week at Unknown time  . OVER THE COUNTER MEDICATION Primrose, cohash, red rasberry leaf   07/19/2018 at Unknown time  . Prenatal Vit-Fe Fumarate-FA (PRENATAL MULTIVITAMIN) TABS tablet Take 1 tablet by mouth daily at 12 noon.     Marland Kitchen levonorgestrel-ethinyl estradiol (AVIANE,ALESSE,LESSINA) 0.1-20 MG-MCG tablet Take 1 tablet by mouth daily.       Review of Systems   Constitutional: Negative for chills, fatigue and fever.  Eyes: Negative for photophobia and visual disturbance.  Respiratory: Negative for shortness of breath.   Gastrointestinal: Positive for abdominal pain.  Genitourinary: Negative for vaginal bleeding and vaginal discharge.  Musculoskeletal: Negative for back pain.  Neurological: Negative for headaches.  All other systems reviewed and are negative.  Physical Exam   Blood pressure (!) 132/96, pulse 75, temperature 98.1 F (36.7 C), temperature source Oral, resp. rate 16, height  (1.6 m), weight 75.8 kg, SpO2 100 %.  Physical Exam  Nursing note and vitals reviewed. Constitutional: She is oriented to person, place, and time. She appears well-developed and well-nourished.  Cardiovascular: Normal rate.  Respiratory: Effort normal.  GI: She exhibits no distension. There is no abdominal tenderness. There is no rebound and no guarding.  Gravid  Neurological: She is alert and oriented to person, place, and time.  Skin: Skin is warm and dry.  Psychiatric: She has a normal mood and affect. Her behavior is normal. Judgment and thought content normal.    MAU Course  Procedures  --Prenatal records up to 06/17/18 visible in chart and reviewed by me --No  severe range blood pressure, no severe symptoms reported by patient --Reactive tracing: baseline 135, mod variability, + accels, - decels --Toco: irregular ctx q 1-5 min, palpate moderate  Patient Vitals for the past 24 hrs:  BP Temp Temp src Pulse Resp SpO2 Height Weight  07/20/18 1115 128/89 - - 95 - - - -  07/20/18 1045 127/90 - - 90 - - - -  07/20/18 1035 (!) 135/96 - - 84 - - - -  07/20/18 1000 133/84 - - 75 - 98 % - -  07/20/18 0955 - - - - - 100 % - -  07/20/18 0950 - - - - - 100 % - -  07/20/18 0945 138/87 - - 83 - - - -  07/20/18 0944 - - - - - 100 % - -  07/20/18 0940 - - - - - 96 % - -  07/20/18 0938 (!) 132/96 - - 75 - - - -  07/20/18 0935 - - - - - 96 % - -   07/20/18 0930 - - - - - 99 % - -  07/20/18 0926 (!) 127/91 - - 89 - - - -  07/20/18 0925 - - - - - 95 % - -  07/20/18 [id number] (!) 131/95 98.1 F (36.7 C) Oral 96 16 96 % - -  07/20/18 [id number] - - - - - - 5\' 3"  (1.6 m) 75.8 kg    Results for orders placed or performed during the hospital encounter of 07/20/18 (from the past 24 hour(s))  CBC with Differential/Platelet     Status: Abnormal   Collection Time: 07/20/18  9:59 AM  Result Value Ref Range   WBC 14.5 (H) 4.0 - 10.5 K/uL   RBC 4.66 3.87 - 5.11 MIL/uL   Hemoglobin 13.8 12.0 - 15.0 g/dL   HCT 54.039.8 98.136.0 - 19.146.0 %   MCV 85.4 80.0 - 100.0 fL   MCH 29.6 26.0 - 34.0 pg   MCHC 34.7 30.0 - 36.0 g/dL   RDW 47.813.1 29.511.5 - 62.115.5 %   Platelets 181 150 - 400 K/uL   nRBC 0.0 0.0 - 0.2 %   Neutrophils Relative % 81 %   Neutro Abs 11.7 (H) 1.7 - 7.7 K/uL   Lymphocytes Relative 11 %   Lymphs Abs 1.6 0.7 - 4.0 K/uL   Monocytes Relative 8 %   Monocytes Absolute 1.2 (H) 0.1 - 1.0 K/uL   Eosinophils Relative 0 %   Eosinophils Absolute 0.0 0.0 - 0.5 K/uL   Basophils Relative 0 %   Basophils Absolute 0.0 0.0 - 0.1 K/uL   Immature Granulocytes 0 %   Abs Immature Granulocytes 0.06 0.00 - 0.07 K/uL  Comprehensive metabolic panel     Status: Abnormal   Collection Time: 07/20/18  9:59 AM  Result Value Ref Range   Sodium 135 135 - 145 mmol/L   Potassium 3.6 3.5 - 5.1 mmol/L   Chloride 102 98 - 111 mmol/L   CO2 19 (L) 22 - 32 mmol/L   Glucose, Bld 105 (H) 70 - 99 mg/dL   BUN 14 6 - 20 mg/dL   Creatinine, Ser 3.080.85 0.44 - 1.00 mg/dL   Calcium 9.8 8.9 - 65.710.3 mg/dL   Total Protein 6.9 6.5 - 8.1 g/dL   Albumin 3.0 (L) 3.5 - 5.0 g/dL   AST 23 15 - 41 U/L   ALT 22 0 - 44 U/L   Alkaline Phosphatase 203 (H) 38 - 126 U/L   Total Bilirubin  0.6 0.3 - 1.2 mg/dL   GFR calc non Af Amer >60 >60 mL/min   GFR calc Af Amer >60 >60 mL/min   Anion gap 14 5 - 15  Protein / creatinine ratio, urine     Status: Abnormal   Collection Time: 07/20/18 10:06 AM  Result Value  Ref Range   Creatinine, Urine 159.68 mg/dL   Total Protein, Urine 54 mg/dL   Protein Creatinine Ratio 0.34 (H) 0.00 - 0.15 mg/mg[Cre]     Assessment and Plan  --32 y.o. G1P0 at [redacted]w[redacted]d  --Reactive tracing, no cervical change --New onset elevated BP with P:Cr of 0.34, report given to Dr. Amado Nash at 1125, recommend admission/IOL --Per Dr. Amado Nash, admit to L&D   Calvert Cantor, CNM 07/20/2018, 11:37 AM

## 2018-07-21 DIAGNOSIS — Z6710 Type A blood, Rh positive: Secondary | ICD-10-CM | POA: Insufficient documentation

## 2018-07-21 LAB — CBC
HCT: 34.1 % — ABNORMAL LOW (ref 36.0–46.0)
Hemoglobin: 11.4 g/dL — ABNORMAL LOW (ref 12.0–15.0)
MCH: 29.1 pg (ref 26.0–34.0)
MCHC: 33.4 g/dL (ref 30.0–36.0)
MCV: 87 fL (ref 80.0–100.0)
Platelets: 168 10*3/uL (ref 150–400)
RBC: 3.92 MIL/uL (ref 3.87–5.11)
RDW: 13.4 % (ref 11.5–15.5)
WBC: 21 10*3/uL — ABNORMAL HIGH (ref 4.0–10.5)
nRBC: 0 % (ref 0.0–0.2)

## 2018-07-21 LAB — RPR: RPR Ser Ql: NONREACTIVE

## 2018-07-21 LAB — COMPREHENSIVE METABOLIC PANEL
ALT: 20 U/L (ref 0–44)
AST: 31 U/L (ref 15–41)
Albumin: 2.3 g/dL — ABNORMAL LOW (ref 3.5–5.0)
Alkaline Phosphatase: 150 U/L — ABNORMAL HIGH (ref 38–126)
Anion gap: 7 (ref 5–15)
BUN: 14 mg/dL (ref 6–20)
CO2: 21 mmol/L — ABNORMAL LOW (ref 22–32)
Calcium: 9 mg/dL (ref 8.9–10.3)
Chloride: 107 mmol/L (ref 98–111)
Creatinine, Ser: 0.82 mg/dL (ref 0.44–1.00)
GFR calc Af Amer: >60 mL/min (ref >60)
GFR calc non Af Amer: >60 mL/min (ref >60)
Glucose, Bld: 95 mg/dL (ref 70–99)
Potassium: 4 mmol/L (ref 3.5–5.1)
Sodium: 135 mmol/L (ref 135–145)
Total Bilirubin: 1 mg/dL (ref 0.3–1.2)
Total Protein: 5.3 g/dL — ABNORMAL LOW (ref 6.5–8.1)

## 2018-07-21 NOTE — Lactation Note (Signed)
This note was copied from a baby's chart. Lactation Consultation Note  Patient Name: Mary Lindsey Today's Date: 07/21/2018  P1, 5 hour female infant. LC entered room, mom and infant asleep.    Maternal Data    Feeding Feeding Type: Breast Fed  LATCH Score Latch: Grasps breast easily, tongue down, lips flanged, rhythmical sucking.  Audible Swallowing: Spontaneous and intermittent  Type of Nipple: Everted at rest and after stimulation  Comfort (Breast/Nipple): Filling, red/small blisters or bruises, mild/mod discomfort  Hold (Positioning): No assistance needed to correctly position infant at breast.  LATCH Score: 9  Interventions Interventions: Breast feeding basics reviewed;Assisted with latch;Skin to skin;Support pillows  Lactation Tools Discussed/Used     Consult Status      Danelle Earthly 07/21/2018, 12:35 AM

## 2018-07-21 NOTE — Progress Notes (Signed)
Patient ID: Mary Lindsey, female   DOB: 11-06-1985, 33 y.o.   MRN: [id number] PPD # 1 S/P NSVD  Live born female  Birth Weight: 7 lb 3.5 oz (3274 g) APGAR: 8, 9  Newborn Delivery   Birth date/time:  07/20/2018 19:15:00 Delivery type:  Vaginal, Spontaneous    Baby name: Evva Delivering provider: MODY, VAISHALI  Episiotomy:Median   Lacerations:3rd degree   Feeding: breast  Pain control at delivery: Local   S:  Reports feeling well, sore perineum but manageable.             Tolerating po/ No nausea or vomiting             Bleeding is light             Pain controlled with ibuprofen (OTC)             Up ad lib / ambulatory / voiding without difficulties   O:  A & O x 3, in no apparent distress              VS:  Vitals:   07/20/18 2135 07/20/18 2246 07/21/18 0241 07/21/18 0650  BP: (!) 131/92 130/84 121/80 101/61  Pulse: 83 71 81 66  Resp: 16 16 16 16   Temp: 98.2 F (36.8 C) 98.7 F (37.1 C) 98.2 F (36.8 C) 98.5 F (36.9 C)  TempSrc: Oral Oral Oral Oral  SpO2:      Weight:      Height:        LABS:  Recent Labs    07/20/18 0959 07/21/18 0533  WBC 14.5* 21.0*  HGB 13.8 11.4*  HCT 39.8 34.1*  PLT 181 168    Blood type: --/--/A POS, A POS Performed at [facility], [address] [place], [place] [address]  [phone]/26 1150)  Rubella: Immune (11/18 0000)   I&O: I/O last 3 completed shifts: In: 0  Out: 217 [Blood:217]          No intake/output data recorded.   Lungs: Clear and unlabored  Heart: regular rate and rhythm / no murmurs  Abdomen: soft, non-tender, non-distended             Fundus: firm, non-tender, U-1  Perineum: repair intact, no edema  Lochia: small  Extremities: no edema, no calf pain or tenderness    A/P: PPD # 1 33 y.o., [id number]   Principal Problem:   Postpartum care following vaginal delivery Active Problems:   Indication for care in labor or delivery   Mild preeclampsia, third trimester  - BP and labs grossly wnl, no s/sx  of PEC   SVD (spontaneous vaginal delivery)   3rd degree perineal, bilateral vaginal sulcus, left longer 4-5 cm   Doing well - stable status  Routine post partum orders  Anticipate discharge tomorrow    Neta Mends, MSN, CNM 07/21/2018, 7:48 AM

## 2018-07-21 NOTE — Lactation Note (Signed)
This note was copied from a baby's chart. Lactation Consultation Note  Patient Name: Girl Ruthan Heitzenrater [id number] Date: 07/21/2018 Reason for consult: Follow-up assessment;Term;1st time breastfeeding;Primapara  P1 mother whose infant is now 37 hours old.    Mother was holding baby STS when I arrived; she had just finished getting her bath.  Mother had no immediate questions/concerns related to breast feeding.  Mother stated that baby was originally pinching when she latched but it has gotten "a lot better."  Educated about the importance of obtaining a wide gape prior to latching to the breast.  Mother is familiar with hand expression and will continue doing this before/after feedings to help establish milk supply.    Encouraged mother to continue feeding 8-12 times/24 hours or sooner if baby shows feeding cues.  Discussed the "second night syndrome" with parents.  Suggested mother call RN/LC for latch assistance as needed.  Mother verbalized understanding.  She has a DEBP for home use.  Father present.   Maternal Data Formula Feeding for Exclusion: No Has patient been taught Hand Expression?: Yes Does the patient have breastfeeding experience prior to this delivery?: No  Feeding    LATCH Score                   Interventions    Lactation Tools Discussed/Used WIC Program: No   Consult Status Consult Status: Follow-up Date: 07/22/18 Follow-up type: In-patient    Zidan Helget R Jeffren Dombek 07/21/2018, 1:43 PM

## 2018-07-21 NOTE — Lactation Note (Signed)
This note was copied from a baby's chart. Lactation Consultation Note  Patient Name: Mary Lindsey Today's Date: 07/21/2018 Reason for consult: Initial assessment;1st time breastfeeding;Term P1, 8 hour female infant. Per mom, infant had one stool since delivery. Mom has breastfeed infant 5 times since delivery. Per mom, she has DEBP at home. Per mom, she feels infant hasn't had a deep latch and she is having  breast soreness. LC noticed a nipple stripe on right breast. Mom was given coconut oil by Nurse earlier and mom knows to hand express colostrum on nipple to help with healing of breast nipple. Mom latched infant on left breast using cross cradle hold, LC mention mom to support her breast using a "C or L position, wait until infant mouth is wide with tongue down, mom brought infant to breast chin first. Per mom, latch felt better and she could tell infant deeper on breast. Infant was breastfeeding for 15 minutes and was still breastfeeding when LC left the room. Mom will attempt to latch infant on right breast but if breast is to sore or pain with latch, mom will latch infant on left breast and hand express right and attempt to latch at another feeding to allow breast to heal. Mom knows to breastfeed according hunger cues, 8 -12 times within 24 hours and breastfeed on demand. Mom knows to call Nurse or LC if she has any breastfeeding questions, concerns or need assistance with latching infant to breast. LC discussed I & O. Reviewed Baby & Me book's Breastfeeding Basics.  Mom made aware of O/P services, breastfeeding support groups, community resources, and our phone # for post-discharge questions.   Maternal Data Formula Feeding for Exclusion: No Has patient been taught Hand Expression?: Yes(Infant was given 1 ml of colostrum on spoon. ) Does the patient have breastfeeding experience prior to this delivery?: No  Feeding Feeding Type: Breast Fed  LATCH Score Latch: Grasps  breast easily, tongue down, lips flanged, rhythmical sucking.  Audible Swallowing: A few with stimulation  Type of Nipple: Everted at rest and after stimulation  Comfort (Breast/Nipple): Filling, red/small blisters or bruises, mild/mod discomfort  Hold (Positioning): Assistance needed to correctly position infant at breast and maintain latch.  LATCH Score: 7  Interventions Interventions: Breast feeding basics reviewed;Assisted with latch;Skin to skin;Breast massage;Hand express;Position options;Expressed milk;Coconut oil;Support pillows;Adjust position  Lactation Tools Discussed/Used WIC Program: No   Consult Status Consult Status: Follow-up Date: 07/21/18 Follow-up type: In-patient    Danelle Earthly 07/21/2018, 3:48 AM

## 2018-07-22 MED ORDER — IBUPROFEN 600 MG PO TABS: 600.0000 mg | ORAL_TABLET | Freq: Four times a day (QID) | ORAL | 1 refills | Status: DC

## 2018-07-22 MED ORDER — ACETAMINOPHEN 325 MG PO TABS: 650.0000 mg | ORAL_TABLET | ORAL | 1 refills | Status: DC | PRN

## 2018-07-22 MED ORDER — DOCUSATE SODIUM 100 MG PO CAPS: 100.0000 mg | ORAL_CAPSULE | Freq: Two times a day (BID) | ORAL | 2 refills | Status: DC

## 2018-07-22 MED ORDER — SENNOSIDES-DOCUSATE SODIUM 8.6-50 MG PO TABS: 2.0000 | ORAL_TABLET | ORAL | 1 refills | Status: DC

## 2018-07-22 NOTE — Discharge Summary (Signed)
OB Discharge Summary  Patient Name: Mary Lindsey DOB: [birth year] MRN: [id number]  Date of admission: 07/20/2018 Delivering MD: MODY, VAISHALI   Date of discharge: 07/22/2018  Admitting diagnosis: 39.5wks, contractions Intrauterine pregnancy: 1576w5d     Secondary diagnosis:Principal Problem:   Postpartum care following vaginal delivery Active Problems:   Indication for care in labor or delivery   Mild preeclampsia, third trimester   SVD (spontaneous vaginal delivery)   3rd degree perineal, bilateral vaginal sulcus, left longer 4-5 cm  Additional problems: None     Discharge diagnosis: Term Pregnancy Delivered and Preeclampsia (mild)                                                                     Post partum procedures:None  Augmentation: Pitocin  Complications: None  Hospital course:  Induction of Labor With Vaginal Delivery   33 y.o. yo G2P1011 at 1076w5d was admitted to the hospital 07/20/2018 for induction of labor.  Indication for induction: Preeclampsia.  Patient had an uncomplicated labor course as follows: Membrane Rupture Time/Date: 4:17 PM ,07/20/2018   Intrapartum Procedures: Episiotomy: Median [2]                                         Lacerations:  3rd degree [4]  Patient had delivery of a Viable infant.  Information for the patient's newborn:  Josephine IgoCaldwell, Girl Takiah [[id number]][[id number]]  Delivery Method: Vaginal, Spontaneous(Filed from Delivery Summary)   07/20/2018  Details of delivery can be found in separate delivery note.  Patient had a routine postpartum course. Patient is discharged home 07/22/18.  On day of discharge patient notes tolerating regular p.o.  Pain controlled with ibuprofen and Tylenol, minimal lochia, positive flatus.  Patient concerned about care of her obstetric laceration and this discussed with patient  Physical exam  Vitals:   07/21/18 0650 07/21/18 0915 07/21/18 1416 07/22/18 0531  BP: 101/61 117/90 118/90 126/85  Pulse: 66 71  71 72  Resp: 16 17 18 16   Temp: 98.5 F (36.9 C) 98.2 F (36.8 C) 98.2 F (36.8 C) 98.2 F (36.8 C)  TempSrc: Oral Oral Oral Oral  SpO2:   97% 98%  Weight:      Height:       General: alert, cooperative and no distress Lochia: appropriate Uterine Fundus: firm Incision: N/A DVT Evaluation: No evidence of DVT seen on physical exam. Labs: Lab Results  Component Value Date   WBC 21.0 (H) 07/21/2018   HGB 11.4 (L) 07/21/2018   HCT 34.1 (L) 07/21/2018   MCV 87.0 07/21/2018   PLT 168 07/21/2018   CMP Latest Ref Rng & Units 07/21/2018  Glucose 70 - 99 mg/dL 95  BUN 6 - 20 mg/dL 14  Creatinine 7.820.44 - 9.561.00 mg/dL 2.130.82  Sodium 086135 - 578145 mmol/L 135  Potassium 3.5 - 5.1 mmol/L 4.0  Chloride 98 - 111 mmol/L 107  CO2 22 - 32 mmol/L 21(L)  Calcium 8.9 - 10.3 mg/dL 9.0  Total Protein 6.5 - 8.1 g/dL 5.3(L)  Total Bilirubin 0.3 - 1.2 mg/dL 1.0  Alkaline Phos 38 - 126 U/L 150(H)  AST 15 -  41 U/L 31  ALT 0 - 44 U/L 20    Discharge instruction: per After Visit Summary and "Baby and Me Booklet".  After Visit Meds:  Allergies as of 07/22/2018   No Known Allergies     Medication List    TAKE these medications   acetaminophen 325 MG tablet Commonly known as:  Tylenol Take 2 tablets (650 mg total) by mouth every 4 (four) hours as needed (for pain scale < 4).   docusate sodium 100 MG capsule Commonly known as:  COLACE Take 1 capsule (100 mg total) by mouth 2 (two) times daily.   ibuprofen 600 MG tablet Commonly known as:  ADVIL Take 1 tablet (600 mg total) by mouth every 6 (six) hours.   levocetirizine 5 MG tablet Commonly known as:  XYZAL Take 5 mg by mouth every evening.   prenatal multivitamin Tabs tablet Take 1 tablet by mouth daily at 12 noon.   senna-docusate 8.6-50 MG tablet Commonly known as:  Senokot-S Take 2 tablets by mouth daily. Start taking on:  Jul 23, 2018       Diet: routine diet  Activity: Advance as tolerated. Pelvic rest for 6 weeks.    Outpatient follow up:6 weeks Follow up Appt:No future appointments. Follow up visit: No follow-ups on file.  Postpartum contraception: Not Discussed  Newborn Data: Live born female  Birth Weight: 7 lb 3.5 oz (3274 g) APGAR: 8, 9  Newborn Delivery   Birth date/time:  07/20/2018 19:15:00 Delivery type:  Vaginal, Spontaneous     Baby Feeding: Breast Disposition:home with mother   07/22/2018 Lendon Colonel, MD

## 2018-07-27 DIAGNOSIS — O9279 Other disorders of lactation: Secondary | ICD-10-CM | POA: Diagnosis not present

## 2018-07-27 DIAGNOSIS — N6459 Other signs and symptoms in breast: Secondary | ICD-10-CM | POA: Diagnosis not present

## 2018-07-28 DIAGNOSIS — O1495 Unspecified pre-eclampsia, complicating the puerperium: Secondary | ICD-10-CM | POA: Diagnosis not present

## 2018-09-07 DIAGNOSIS — Z30430 Encounter for insertion of intrauterine contraceptive device: Secondary | ICD-10-CM | POA: Diagnosis not present

## 2018-09-07 DIAGNOSIS — Z3201 Encounter for pregnancy test, result positive: Secondary | ICD-10-CM | POA: Diagnosis not present

## 2018-10-11 DIAGNOSIS — Z30431 Encounter for routine checking of intrauterine contraceptive device: Secondary | ICD-10-CM | POA: Diagnosis not present

## 2018-10-14 DIAGNOSIS — Z20828 Contact with and (suspected) exposure to other viral communicable diseases: Secondary | ICD-10-CM | POA: Diagnosis not present

## 2018-12-22 DIAGNOSIS — Z130 Encounter for screening for diseases of the blood and blood-forming organs and certain disorders involving the immune mechanism: Secondary | ICD-10-CM | POA: Diagnosis not present

## 2018-12-22 DIAGNOSIS — Z0000 Encounter for general adult medical examination without abnormal findings: Secondary | ICD-10-CM | POA: Diagnosis not present

## 2018-12-22 DIAGNOSIS — Z1329 Encounter for screening for other suspected endocrine disorder: Secondary | ICD-10-CM | POA: Diagnosis not present

## 2018-12-22 DIAGNOSIS — Z6824 Body mass index (BMI) 24.0-24.9, adult: Secondary | ICD-10-CM | POA: Diagnosis not present

## 2018-12-22 DIAGNOSIS — Z01419 Encounter for gynecological examination (general) (routine) without abnormal findings: Secondary | ICD-10-CM | POA: Diagnosis not present

## 2018-12-22 DIAGNOSIS — Z13220 Encounter for screening for lipoid disorders: Secondary | ICD-10-CM | POA: Diagnosis not present

## 2021-02-24 NOTE — L&D Delivery Note (Signed)
Delivery Note At 1344 a viable and healthy female was delivered via svd (Presentation: cephalic; op  ).  APGAR: 8,9 ; weight  6'6".   Placenta status: delivered spontaneously,intact .  Cord: 3vv, with the following complications: very long cord, but not body or nuchal.  Anesthesia: none; local used for repair Episiotomy:  right mediolateral --- recurrent late decels to 80s and tight perineum, right mediolateral cut w/o difficulty and then fetus delivered with next contraction Lacerations:  2nd degree perineal, bilat vaginal, bilat labial Suture Repair: 3.0 vicryl Est. Blood Loss (mL):   Mom to postpartum.  Baby to Couplet care / Skin to Skin.  Vick Frees 10/11/2021, 3:27 PM

## 2021-03-01 LAB — OB RESULTS CONSOLE ANTIBODY SCREEN: Antibody Screen: NEGATIVE

## 2021-04-03 LAB — OB RESULTS CONSOLE HEPATITIS B SURFACE ANTIGEN: Hepatitis B Surface Ag: NEGATIVE

## 2021-04-03 LAB — OB RESULTS CONSOLE RUBELLA ANTIBODY, IGM: Rubella: IMMUNE

## 2021-04-03 LAB — OB RESULTS CONSOLE HIV ANTIBODY (ROUTINE TESTING): HIV: NONREACTIVE

## 2021-04-03 LAB — OB RESULTS CONSOLE GC/CHLAMYDIA
Chlamydia: NEGATIVE
Neisseria Gonorrhea: NEGATIVE

## 2021-04-03 LAB — OB RESULTS CONSOLE RPR: RPR: NONREACTIVE

## 2021-09-18 LAB — OB RESULTS CONSOLE GBS: GBS: NEGATIVE

## 2021-10-04 ENCOUNTER — Encounter (HOSPITAL_COMMUNITY): Payer: Self-pay | Admitting: *Deleted

## 2021-10-04 ENCOUNTER — Encounter (HOSPITAL_COMMUNITY): Payer: Self-pay

## 2021-10-04 ENCOUNTER — Telehealth (HOSPITAL_COMMUNITY): Payer: Self-pay | Admitting: *Deleted

## 2021-10-04 NOTE — Telephone Encounter (Signed)
Preadmission screenPreadmission screen 

## 2021-10-08 ENCOUNTER — Telehealth (HOSPITAL_COMMUNITY): Payer: Self-pay | Admitting: *Deleted

## 2021-10-08 ENCOUNTER — Encounter (HOSPITAL_COMMUNITY): Payer: Self-pay | Admitting: *Deleted

## 2021-10-08 NOTE — Telephone Encounter (Signed)
Preadmission screen  

## 2021-10-10 ENCOUNTER — Other Ambulatory Visit: Payer: Self-pay | Admitting: Obstetrics and Gynecology

## 2021-10-11 ENCOUNTER — Other Ambulatory Visit: Payer: Self-pay

## 2021-10-11 ENCOUNTER — Inpatient Hospital Stay (HOSPITAL_COMMUNITY)
Admission: AD | Admit: 2021-10-11 | Discharge: 2021-10-12 | DRG: 806 | Disposition: A | Discharge status: Home / Self Care | Payer: BC Managed Care – PPO | Attending: Obstetrics and Gynecology | Admitting: Obstetrics and Gynecology

## 2021-10-11 ENCOUNTER — Inpatient Hospital Stay (HOSPITAL_COMMUNITY): Payer: BC Managed Care – PPO

## 2021-10-11 ENCOUNTER — Encounter (HOSPITAL_COMMUNITY): Payer: Self-pay | Admitting: Obstetrics and Gynecology

## 2021-10-11 DIAGNOSIS — O9081 Anemia of the puerperium: Secondary | ICD-10-CM | POA: Diagnosis not present

## 2021-10-11 DIAGNOSIS — Z3490 Encounter for supervision of normal pregnancy, unspecified, unspecified trimester: Principal | ICD-10-CM | POA: Diagnosis present

## 2021-10-11 DIAGNOSIS — O403XX0 Polyhydramnios, third trimester, not applicable or unspecified: Secondary | ICD-10-CM | POA: Diagnosis present

## 2021-10-11 DIAGNOSIS — D62 Acute posthemorrhagic anemia: Secondary | ICD-10-CM | POA: Diagnosis not present

## 2021-10-11 DIAGNOSIS — O365930 Maternal care for other known or suspected poor fetal growth, third trimester, not applicable or unspecified: Principal | ICD-10-CM | POA: Diagnosis present

## 2021-10-11 DIAGNOSIS — Z3A39 39 weeks gestation of pregnancy: Secondary | ICD-10-CM

## 2021-10-11 DIAGNOSIS — Z8616 Personal history of COVID-19: Secondary | ICD-10-CM

## 2021-10-11 LAB — CBC
HCT: 30.1 % — ABNORMAL LOW (ref 36.0–46.0)
Hemoglobin: 9.6 g/dL — ABNORMAL LOW (ref 12.0–15.0)
MCH: 25.2 pg — ABNORMAL LOW (ref 26.0–34.0)
MCHC: 31.9 g/dL (ref 30.0–36.0)
MCV: 79 fL — ABNORMAL LOW (ref 80.0–100.0)
Platelets: 205 10*3/uL (ref 150–400)
RBC: 3.81 MIL/uL — ABNORMAL LOW (ref 3.87–5.11)
RDW: 14.6 % (ref 11.5–15.5)
WBC: 10.8 10*3/uL — ABNORMAL HIGH (ref 4.0–10.5)
nRBC: 0 % (ref 0.0–0.2)

## 2021-10-11 LAB — TYPE AND SCREEN
ABO/RH(D): A POS
Antibody Screen: NEGATIVE

## 2021-10-11 LAB — RPR: RPR Ser Ql: NONREACTIVE

## 2021-10-11 MED ORDER — ONDANSETRON HCL 4 MG PO TABS: 4.0000 mg | ORAL_TABLET | ORAL | Status: DC | PRN

## 2021-10-11 MED ORDER — OXYTOCIN-SODIUM CHLORIDE 30-0.9 UT/500ML-% IV SOLN: 1.0000 m[IU]/min | INTRAVENOUS | Status: DC

## 2021-10-11 MED ORDER — ONDANSETRON HCL 4 MG/2ML IJ SOLN
4.0000 mg | Freq: Four times a day (QID) | INTRAMUSCULAR | Status: DC | PRN
  Administered 2021-10-11: 4 mg via INTRAVENOUS
  Filled 2021-10-11: qty 2

## 2021-10-11 MED ORDER — LIDOCAINE HCL (PF) 1 % IJ SOLN
30.0000 mL | INTRAMUSCULAR | Status: AC | PRN
  Administered 2021-10-11: 30 mL via SUBCUTANEOUS

## 2021-10-11 MED ORDER — DIBUCAINE (PERIANAL) 1 % EX OINT: 1.0000 | TOPICAL_OINTMENT | CUTANEOUS | Status: DC | PRN

## 2021-10-11 MED ORDER — SIMETHICONE 80 MG PO CHEW: 80.0000 mg | CHEWABLE_TABLET | ORAL | Status: DC | PRN

## 2021-10-11 MED ORDER — TETANUS-DIPHTH-ACELL PERTUSSIS 5-2.5-18.5 LF-MCG/0.5 IM SUSY: 0.5000 mL | PREFILLED_SYRINGE | Freq: Once | INTRAMUSCULAR | Status: DC

## 2021-10-11 MED ORDER — LACTATED RINGERS IV SOLN: 500.0000 mL | INTRAVENOUS | Status: DC | PRN

## 2021-10-11 MED ORDER — IBUPROFEN 600 MG PO TABS
600.0000 mg | ORAL_TABLET | Freq: Four times a day (QID) | ORAL | Status: DC
  Administered 2021-10-11 – 2021-10-12 (×4): 600 mg via ORAL
  Filled 2021-10-11 (×4): qty 1

## 2021-10-11 MED ORDER — TERBUTALINE SULFATE 1 MG/ML IJ SOLN: 0.2500 mg | Freq: Once | INTRAMUSCULAR | Status: DC | PRN

## 2021-10-11 MED ORDER — OXYTOCIN-SODIUM CHLORIDE 30-0.9 UT/500ML-% IV SOLN
2.5000 [IU]/h | INTRAVENOUS | Status: DC
  Administered 2021-10-11: 2.5 [IU]/h via INTRAVENOUS

## 2021-10-11 MED ORDER — COCONUT OIL OIL: 1.0000 | TOPICAL_OIL | Status: DC | PRN

## 2021-10-11 MED ORDER — PRENATAL MULTIVITAMIN CH
1.0000 | ORAL_TABLET | Freq: Every day | ORAL | Status: DC
  Administered 2021-10-12: 1 via ORAL
  Filled 2021-10-11: qty 1

## 2021-10-11 MED ORDER — BENZOCAINE-MENTHOL 20-0.5 % EX AERO
1.0000 | INHALATION_SPRAY | CUTANEOUS | Status: DC | PRN
  Filled 2021-10-11: qty 56

## 2021-10-11 MED ORDER — OXYTOCIN BOLUS FROM INFUSION
333.0000 mL | Freq: Once | INTRAVENOUS | Status: AC
  Administered 2021-10-11: 333 mL via INTRAVENOUS

## 2021-10-11 MED ORDER — OXYTOCIN-SODIUM CHLORIDE 30-0.9 UT/500ML-% IV SOLN
1.0000 m[IU]/min | INTRAVENOUS | Status: DC
  Administered 2021-10-11: 1 m[IU]/min via INTRAVENOUS
  Filled 2021-10-11: qty 500

## 2021-10-11 MED ORDER — ZOLPIDEM TARTRATE 5 MG PO TABS: 5.0000 mg | ORAL_TABLET | Freq: Every evening | ORAL | Status: DC | PRN

## 2021-10-11 MED ORDER — ONDANSETRON HCL 4 MG/2ML IJ SOLN: 4.0000 mg | INTRAMUSCULAR | Status: DC | PRN

## 2021-10-11 MED ORDER — LACTATED RINGERS IV SOLN: INTRAVENOUS | Status: DC

## 2021-10-11 MED ORDER — ACETAMINOPHEN 325 MG PO TABS: 650.0000 mg | ORAL_TABLET | ORAL | Status: DC | PRN

## 2021-10-11 MED ORDER — DIPHENHYDRAMINE HCL 25 MG PO CAPS: 25.0000 mg | ORAL_CAPSULE | Freq: Four times a day (QID) | ORAL | Status: DC | PRN

## 2021-10-11 MED ORDER — WITCH HAZEL-GLYCERIN EX PADS: 1.0000 | MEDICATED_PAD | CUTANEOUS | Status: DC | PRN

## 2021-10-11 MED ORDER — SOD CITRATE-CITRIC ACID 500-334 MG/5ML PO SOLN: 30.0000 mL | ORAL | Status: DC | PRN

## 2021-10-11 MED ORDER — SENNOSIDES-DOCUSATE SODIUM 8.6-50 MG PO TABS
2.0000 | ORAL_TABLET | Freq: Every day | ORAL | Status: DC
  Filled 2021-10-11: qty 2

## 2021-10-11 NOTE — Lactation Note (Signed)
This note was copied from a baby's chart. Lactation Consultation Note  Patient Name: Mary Lindsey Today's Date: 10/11/2021   Age: 35 hrs  LC went in to see parents to assist with breastfeeding. Infant had recent feeding. Parents preference to be seen in the am.   Maternal Data    Feeding    LATCH Score                    Lactation Tools Discussed/Used    Interventions    Discharge    Consult Status      Breland Elders  Nicholson-Springer 10/11/2021, 10:59 PM

## 2021-10-11 NOTE — H&P (Signed)
Mary Lindsey is a 36 y.o. G3P1011 at [redacted]w[redacted]d gestation presents for IOL d/t IUGR. +FM, no ctx.  Antepartum course:  covid in pregnancy;   echogenic bowel, IUGR (efw 9%) with nml dopplers and reassuring ante testing; polyhydramnios; h/o pre-eclampsia, was on baby asa q day [facility]are at [facility] since 12 wks.  See complete pre-natal records  History OB History     Gravida  3   Para  1   Term  1   Preterm  0   AB  1   Living  1      SAB  1   IAB  0   Ectopic  0   Multiple  0   Live Births  1          Past Medical History:  Diagnosis Date   GERD (gastroesophageal reflux disease)    Tums help   Medical history non-contributory    Mild preeclampsia, third trimester 07/20/2018   Past Surgical History:  Procedure Laterality Date   NO PAST SURGERIES     Family History: family history includes Colon cancer in her father; Heart disease in her paternal grandmother. Social History:  reports that she has never smoked. She has never used smokeless tobacco. She reports that she does not drink alcohol and does not use drugs.  ROS: See above otherwise negative  Prenatal labs:  ABO, Rh: --/--/A POS (08/18 0050) Antibody: NEG (08/18 0050) Rubella: Immune (02/08 0000) RPR: NON REACTIVE (08/18 0050)  HBsAg: Negative (02/08 0000)  HIV:Non-reactive (02/08 0000)  GBS: Negative/-- (07/26 0000)  1 hr Glucola: Normal Genetic screening: declined Anatomy US:  echogenic bowel  Physical Exam:   Dilation: 10 Effacement (%): 80 Station: -1 Exam by:: Dr Amado Nash Blood pressure 131/74, pulse 87, temperature 98 F (36.7 C), temperature source Oral, resp. rate 16, height 5\' 4"  (1.626 m), weight 78.2 kg, unknown if currently breastfeeding. A&O x 3 HEENT: Normal Lungs: CTAB CV: RRR Abdominal: Soft, Non-tender, Gravid, and Estimated fetal weight: 6 1/2  lbs  Lower Extremities: Non-edematous, Non-tender  Pelvic Exam:      Dilatation: 10cm     Effacement: 100%      Station: -2     Presentation: Cephalic  Labs:  CBC:  Lab Results  Component Value Date   WBC 10.8 (H) 10/11/2021   RBC 3.81 (L) 10/11/2021   HGB 9.6 (L) 10/11/2021   HCT 30.1 (L) 10/11/2021   MCV 79.0 (L) 10/11/2021   MCH 25.2 (L) 10/11/2021   MCHC 31.9 10/11/2021   RDW 14.6 10/11/2021   PLT 205 10/11/2021     Prenatal Transfer Tool  Maternal Diabetes: No Genetic Screening: Declined Maternal Ultrasounds/Referrals: Echogenic bowel Fetal Ultrasounds or other Referrals:  None Maternal Substance Abuse:  No Significant Maternal Medications:  None Significant Maternal Lab Results: Group B Strep negative  FHT: 120s, nml variability, decel to 80s during exam, lasted about 2 min then back to baseline; occasional late decel to 80s then back to baseline with continued normal variablity and accels; then ctx q min and q min late - pitocin turned off and then decels stopped and baseline back tot 120s with nml variability; during this time fse placed but then was knocked off few min later; by then able to pick up FHT well and 120s TOCO: 1-2 after pitocin off, q min just prior to pit turned off  Assessment/Plan:  36 y.o. G3P1011 at [redacted]w[redacted]d gestation   IOL - no epidural, will allow fetal descent then  begin pushing when pt ready, anticipate svd soon; has make quick cervical change after srom Fetal status cat 1/2, overall reassuring and will closely monitor IUGR - efw 9%  Covid in early pregnancy Gbs neg Polyhydramnios, nml afi at last visit H/o pre-e with G1 Anemia of pregnancy - follow pp  Vick Frees 10/11/2021, 3:09 PM

## 2021-10-12 LAB — CBC
HCT: 27.2 % — ABNORMAL LOW (ref 36.0–46.0)
Hemoglobin: 8.8 g/dL — ABNORMAL LOW (ref 12.0–15.0)
MCH: 25.5 pg — ABNORMAL LOW (ref 26.0–34.0)
MCHC: 32.4 g/dL (ref 30.0–36.0)
MCV: 78.8 fL — ABNORMAL LOW (ref 80.0–100.0)
Platelets: 197 10*3/uL (ref 150–400)
RBC: 3.45 MIL/uL — ABNORMAL LOW (ref 3.87–5.11)
RDW: 14.8 % (ref 11.5–15.5)
WBC: 13.6 10*3/uL — ABNORMAL HIGH (ref 4.0–10.5)
nRBC: 0 % (ref 0.0–0.2)

## 2021-10-12 MED ORDER — IBUPROFEN 600 MG PO TABS: 600.0000 mg | ORAL_TABLET | Freq: Four times a day (QID) | ORAL | 0 refills | Status: AC

## 2021-10-12 NOTE — Lactation Note (Signed)
This note was copied from a baby's chart. Lactation Consultation Note  Patient Name: Mary Lindsey Date: 10/12/2021 Reason for consult: Initial assessment;Term;Infant weight loss;Breastfeeding assistance (1.28% WL) Age:35 hours  P2, Term, Infant Female  LC entered the room and baby was STS with supporting parent. Per birth parent, baby has fed 7 times so far. The parents stated that they were unsure if she was eating enough. LC spoke with the parents about infant behavior in the 1st 24 hours. The parents stated that they my go home today.   Per the birth parent she fed her older child for 2 years.   LC did not see a latch.   LC encouraged the parents to call when baby is ready to feed.   The parents state that they have no further questions or concerns.   Current Feeding Plan Breastfeed 8+ times in 24 hours.  Hand express for stimulation and spoon feed expressed milk to baby.  Call RN/LC for assistance with latch.   Maternal Data Has patient been taught Hand Expression?: Yes Does the patient have breastfeeding experience prior to this delivery?: Yes How long did the patient breastfeed?: 2 years  Feeding Mother's Current Feeding Choice: Breast Milk  LATCH Score                    Lactation Tools Discussed/Used    Interventions Interventions: Breast feeding basics reviewed;Education;LC Services brochure  Discharge Pump: DEBP;Personal  Consult Status Consult Status: Follow-up Date: 10/13/21 Follow-up type: In-patient    Mary Lindsey Mary Lindsey 10/12/2021, 9:25 AM

## 2021-10-12 NOTE — Lactation Note (Signed)
This note was copied from a baby's chart. Lactation Consultation Note  Patient Name: Girl Soliyana Mcchristian JASNK'N Date: 10/12/2021 Reason for consult: Follow-up assessment;Mother's request;Infant weight loss;Breastfeeding assistance (1.28% WL) Age:35 hours  P2, Term, Infant Female, 1.28% WL  LC called to the room for a latch check. Baby was latched to the right breast in the cross-cradle position. Lips were flanged, tongue was down, and sucking was rhythmic with stimulation. Baby was feeding prior to [facility] entering the room and was falling asleep at the breast.   Maternal Data    Feeding    LATCH Score Latch: Grasps breast easily, tongue down, lips flanged, rhythmical sucking.  Audible Swallowing: A few with stimulation (Baby falling asleep at the breast.)  Type of Nipple: Everted at rest and after stimulation  Comfort (Breast/Nipple): Soft / non-tender  Hold (Positioning): No assistance needed to correctly position infant at breast.  LATCH Score: 9   Lactation Tools Discussed/Used    Interventions    Discharge    Consult Status Consult Status: Follow-up Date: 10/13/21 Follow-up type: In-patient    Delene Loll 10/12/2021, 3:16 PM

## 2021-10-12 NOTE — Progress Notes (Signed)
No c/o; nml lochia; voids w/o difficulty; pain controlled Breastfeeding, girl; wants d/c home  Patient Vitals for the past 24 hrs:  BP Temp Temp src Pulse Resp SpO2  10/12/21 0510 120/84 98.3 F (36.8 C) Oral 73 18 100 %  10/12/21 0045 126/79 98 F (36.7 C) -- (!) 59 16 99 %  10/11/21 2050 125/83 98.5 F (36.9 C) Oral 68 18 100 %  10/11/21 1645 131/84 98.8 F (37.1 C) Oral 68 16 99 %  10/11/21 1545 128/87 98.3 F (36.8 C) Oral 71 16 98 %  10/11/21 1533 124/71 -- -- 67 -- --  10/11/21 1515 117/74 -- -- 66 -- --  10/11/21 1500 126/74 -- -- 79 -- --  10/11/21 1445 131/74 -- -- 87 -- --  10/11/21 1430 130/77 -- -- 73 -- --  10/11/21 1415 124/74 -- -- 73 -- --  10/11/21 1400 133/77 -- -- 70 -- --  10/11/21 1356 133/69 -- -- 68 -- --  10/11/21 1141 126/82 -- -- 69 -- --  10/11/21 1026 97/60 -- -- (!) 58 -- --   A&ox3 Nml respirations Abd: soft, nt, nd; fundus firm and below umb LE: no edema, nt bilat     Latest Ref Rng & Units 10/12/2021    5:12 AM 10/11/2021   12:50 AM 07/21/2018    5:33 AM  CBC  WBC 4.0 - 10.5 K/uL 13.6  10.8  21.0   Hemoglobin 12.0 - 15.0 g/dL 8.8  9.6  04.5   Hematocrit 36.0 - 46.0 % 27.2  30.1  34.1   Platelets 150 - 400 K/uL 197  205  168    A/P: ppd1 s/p svd Doing well, wants d/c home; plan f/u 6 wk Rh pos RI Breastfeeding Anemia of pregnancy with acute chage d/t blood loss - asymptomatic, plan iron q day

## 2021-10-12 NOTE — Discharge Summary (Signed)
Postpartum Discharge Summary  Date of Service updated     Patient Name: Mary Lindsey DOB: 1985-08-08 MRN: [id number]  Date of admission: 10/11/2021 Delivery date:10/11/2021  Delivering provider: Rhoderick Moody E  Date of discharge: 10/12/2021  Admitting diagnosis: Encounter for induction of labor [Z34.90] Intrauterine pregnancy: [redacted]w[redacted]d     Secondary diagnosis:  Principal Problem:   Encounter for induction of labor  Additional problems: none    Discharge diagnosis: Term Pregnancy Delivered                                              Post partum procedures: none Augmentation: Pitocin, arom Complications: None  Hospital course: Induction of Labor With Vaginal Delivery   36 y.o. yo [id number] at [redacted]w[redacted]d was admitted to the hospital 10/11/2021 for induction of labor.  Indication for induction:  iugr .  Patient had an uncomplicated labor course as follows: Membrane Rupture Time/Date: 10:46 AM ,10/11/2021   Delivery Method:Vaginal, Spontaneous  Episiotomy: Right Mediolateral  Lacerations:  2nd degree;Labial;Vaginal  Details of delivery can be found in separate delivery note.  Patient had a routine postpartum course. Patient is discharged home 10/12/21.  Newborn Data: Birth date:10/11/2021  Birth time:1:44 PM  Gender:Female  Living status:Living  Apgars:8 ,9  Weight:2900 g    Physical exam  Vitals:   10/11/21 1645 10/11/21 2050 10/12/21 0045 10/12/21 0510  BP: 131/84 125/83 126/79 120/84  Pulse: 68 68 (!) 59 73  Resp: 16 18 16 18   Temp: 98.8 F (37.1 C) 98.5 F (36.9 C) 98 F (36.7 C) 98.3 F (36.8 C)  TempSrc: Oral Oral  Oral  SpO2: 99% 100% 99% 100%  Weight:      Height:       General: alert, cooperative, and no distress Lochia: appropriate Uterine Fundus: firm Incision: Healing well with no significant drainage DVT Evaluation: No evidence of DVT seen on physical exam. Labs: Lab Results  Component Value Date   WBC 13.6 (H) 10/12/2021   HGB 8.8 (L)  10/12/2021   HCT 27.2 (L) 10/12/2021   MCV 78.8 (L) 10/12/2021   PLT 197 10/12/2021      Latest Ref Rng & Units 07/21/2018    5:33 AM  CMP  Glucose 70 - 99 mg/dL 95   BUN 6 - 20 mg/dL 14   Creatinine 07/23/2018 - 1.00 mg/dL 8.24   Sodium 2.35 - 361 mmol/L 135   Potassium 3.5 - 5.1 mmol/L 4.0   Chloride 98 - 111 mmol/L 107   CO2 22 - 32 mmol/L 21   Calcium 8.9 - 10.3 mg/dL 9.0   Total Protein 6.5 - 8.1 g/dL 5.3   Total Bilirubin 0.3 - 1.2 mg/dL 1.0   Alkaline Phos 38 - 126 U/L 150   AST 15 - 41 U/L 31   ALT 0 - 44 U/L 20    Edinburgh Score:    10/11/2021    3:45 PM  Edinburgh Postnatal Depression Scale Screening Tool  I have been able to laugh and see the funny side of things. 0  I have looked forward with enjoyment to things. 0  I have blamed myself unnecessarily when things went wrong. 1  I have been anxious or worried for no good reason. 0  I have felt scared or panicky for no good reason. 0  Things have been getting on top of  me. 1  I have been so unhappy that I have had difficulty sleeping. 0  I have felt sad or miserable. 0  I have been so unhappy that I have been crying. 1  The thought of harming myself has occurred to me. 0  Edinburgh Postnatal Depression Scale Total 3      After visit meds:  Allergies as of 10/12/2021   No Known Allergies      Medication List     STOP taking these medications    acetaminophen 325 MG tablet Commonly known as: Tylenol   docusate sodium 100 MG capsule Commonly known as: COLACE   levocetirizine 5 MG tablet Commonly known as: XYZAL   prenatal multivitamin Tabs tablet   senna-docusate 8.6-50 MG tablet Commonly known as: Senokot-S       TAKE these medications    ibuprofen 600 MG tablet Commonly known as: ADVIL Take 1 tablet (600 mg total) by mouth every 6 (six) hours.         Discharge home in stable condition Infant Feeding: Breast Infant Disposition:home with mother Discharge instruction: per After Visit  Summary and Postpartum booklet. Activity: Advance as tolerated. Pelvic rest for 6 weeks.  Diet: routine diet Anticipated Birth Control: Unsure Postpartum Appointment:6 weeks Additional Postpartum F/U:  none Future Appointments:No future appointments. Follow up Visit:      10/12/2021 Vick Frees, MD

## 2021-10-12 NOTE — Lactation Note (Signed)
This note was copied from a baby's chart. Lactation Consultation Note  Patient Name: Mary Lindsey Today's Date: 10/12/2021   Age:35 hours  LC attempted to visit with the birth parent, but they were asleep. Birth parent asked if LC could come back later.   Maternal Data    Feeding    LATCH Score                    Lactation Tools Discussed/Used    Interventions    Discharge    Consult Status      Mary Lindsey 10/12/2021, 7:40 AM

## 2021-10-12 NOTE — Lactation Note (Signed)
This note was copied from a baby's chart. Lactation Consultation Note  Patient Name: Mary Lindsey Today's Date: 10/12/2021   Age:35 hours P2, term female infant, Birth Parent would like to be seen by [facility] services in the morning.  Maternal Data    Feeding    LATCH Score                    Lactation Tools Discussed/Used    Interventions    Discharge    Consult Status      Danelle Earthly 10/12/2021, 12:54 AM

## 2021-10-21 ENCOUNTER — Telehealth (HOSPITAL_COMMUNITY): Payer: Self-pay | Admitting: *Deleted

## 2021-10-21 NOTE — Telephone Encounter (Signed)
Mom reports feeling good. No concerns about herself at this time. EPDS=3 [facility] score=3) Mom reports baby is doing well. Feeding, peeing, and pooping without difficulty. Safe sleep reviewed. Mom reports no concerns about baby at present.  Duffy Rhody, RN 10-21-2021 at 10:15am
